# Patient Record
Sex: Male | Born: 1980 | Race: White | Hispanic: No | Marital: Single | Smoking: Never smoker
Health system: Southern US, Community
[De-identification: ages and names within clinical notes are randomized; demographics above are authoritative.]

## PROBLEM LIST (undated history)

## (undated) DIAGNOSIS — S066X9A Traumatic subarachnoid hemorrhage with loss of consciousness of unspecified duration, initial encounter: Secondary | ICD-10-CM

## (undated) DIAGNOSIS — S060X9A Concussion with loss of consciousness of unspecified duration, initial encounter: Secondary | ICD-10-CM

## (undated) HISTORY — DX: Traumatic subarachnoid hemorrhage with loss of consciousness of unspecified duration, initial encounter: S06.6X9A

## (undated) HISTORY — DX: Concussion with loss of consciousness of unspecified duration, initial encounter: S06.0X9A

---

## 2013-01-28 HISTORY — PX: REFRACTIVE SURGERY: SHX103

## 2017-08-19 ENCOUNTER — Telehealth: Payer: Self-pay | Admitting: Neurology

## 2017-08-19 ENCOUNTER — Ambulatory Visit: Payer: Managed Care, Other (non HMO) | Admitting: Neurology

## 2017-08-19 ENCOUNTER — Encounter: Payer: Self-pay | Admitting: Neurology

## 2017-08-19 VITALS — BP 134/99 | HR 101 | Ht 72.0 in | Wt 277.5 lb

## 2017-08-19 DIAGNOSIS — S060X9A Concussion with loss of consciousness of unspecified duration, initial encounter: Secondary | ICD-10-CM | POA: Diagnosis not present

## 2017-08-19 DIAGNOSIS — S065X9A Traumatic subdural hemorrhage with loss of consciousness of unspecified duration, initial encounter: Secondary | ICD-10-CM

## 2017-08-19 DIAGNOSIS — S065XAA Traumatic subdural hemorrhage with loss of consciousness status unknown, initial encounter: Secondary | ICD-10-CM

## 2017-08-19 DIAGNOSIS — S066XAA Traumatic subarachnoid hemorrhage with loss of consciousness status unknown, initial encounter: Secondary | ICD-10-CM | POA: Insufficient documentation

## 2017-08-19 DIAGNOSIS — S066X9A Traumatic subarachnoid hemorrhage with loss of consciousness of unspecified duration, initial encounter: Secondary | ICD-10-CM

## 2017-08-19 DIAGNOSIS — S066X1S Traumatic subarachnoid hemorrhage with loss of consciousness of 30 minutes or less, sequela: Secondary | ICD-10-CM

## 2017-08-19 HISTORY — DX: Traumatic subarachnoid hemorrhage with loss of consciousness of unspecified duration, initial encounter: S06.6X9A

## 2017-08-19 HISTORY — DX: Traumatic subarachnoid hemorrhage with loss of consciousness status unknown, initial encounter: S06.6XAA

## 2017-08-19 HISTORY — DX: Concussion with loss of consciousness of unspecified duration, initial encounter: S06.0X9A

## 2017-08-19 MED ORDER — GABAPENTIN 300 MG PO CAPS: ORAL_CAPSULE | ORAL | 3 refills | Status: DC

## 2017-08-19 NOTE — Telephone Encounter (Signed)
[employer] order sent to GI. They obtain the auth and will reach out to the pt to schedule.  °

## 2017-08-19 NOTE — Progress Notes (Signed)
Reason for visit: Head trauma, concussion  Referring physician: Dr. Zachery DakinsSandbutle  John Hurst is a 37 y.o. male  History of present illness:  John Hurst is a 37 year old gentleman with a history of a fall that occurred on 26 July 2017.  The patient was in [place] at the time.  The patient hit a plate of glass on the back of the head resulting in a laceration.  The patient had loss of consciousness with the fall that lasted several minutes.  The patient was admitted to [facility] and underwent a CT scan of the brain that showed evidence of multifocal subarachnoid and subdural bleeding.  The patient was placed on phenytoin for about 1 week following the accident, he returned home, and he has not returned to work.  He has good and bad days with his symptoms.  He has developed neck and low back pain and has been seen through sports medicine for this.  He was placed on Flexeril temporarily which offered some benefit.  The patient is having a lot of difficulty with sleeping in part related to pain, he averages about 3 or 4 hours of sleep at night.  The patient has difficulty with focusing, but some days are much better than others.  He will have headaches off and on, the headaches are not daily in nature.  He reports no focal numbness or weakness of the face, arms, legs.  He denies any significant changes in balance or difficulty controlling the bowels or the bladder.  He will have occasional episodes of vertigo.  The patient comes to this office for an evaluation.  The patient works in the Estate manager/land agentfinancial field, he does have difficulty focusing, he denies any change in general memory.  Past Medical History:  Diagnosis Date  . Concussion with < 1 hr loss of consciousness 08/19/2017    Past Surgical History:  Procedure Laterality Date  . REFRACTIVE SURGERY  2015    History reviewed. No pertinent family history.  Social history:  reports that he has never smoked. He has never  used smokeless tobacco. He reports that he drinks alcohol. He reports that he does not use drugs.  Medications:  Prior to Admission medications   Medication Sig Start Date End Date Taking? Authorizing Provider  cyclobenzaprine (FLEXERIL) 10 MG tablet Take 1 tablet by mouth 3 (three) times daily as needed.  08/14/17  Yes [provider]  ibuprofen (ADVIL,MOTRIN) 200 MG tablet Take 200 mg by mouth every 6 (six) hours as needed.   Yes [provider]  naproxen (NAPROSYN) 500 MG tablet Take 500 mg by mouth as needed.   Yes [provider]  gabapentin (NEURONTIN) 300 MG capsule One capsule at night for 1 week, then take one twice a day 08/19/17   York SpanielWillis, Charles K, MD     No Known Allergies  ROS:  Out of a complete 14 system review of symptoms, the patient complains only of the following symptoms, and all other reviewed systems are negative.  Fatigue Diarrhea Confusion, headache, weakness, dizziness Depression, decreased energy, disinterest in activities Insomnia, sleepiness  Blood pressure (!) 134/99, pulse (!) 101, height 6' (1.829 m), weight 277 lb 8 oz (125.9 kg).  Physical Exam  General: The patient is alert and cooperative at the time of the examination.  The patient is moderately to markedly obese.  Eyes: Pupils are equal, round, and reactive to light. Discs are flat bilaterally.  Neck: The neck is supple, no carotid  bruits are noted.  Respiratory: The respiratory examination is clear.  Cardiovascular: The cardiovascular examination reveals a regular rate and rhythm, no obvious murmurs or rubs are noted.  Neuromuscular: Range move the cervical spine is full, patient is able to flex the low back to about 85 degrees.  Skin: Extremities are without significant edema.  Neurologic Exam  Mental status: The patient is alert and oriented x 3 at the time of the examination. The patient has apparent normal recent and remote memory, with an apparently normal  attention span and concentration ability.  Cranial nerves: Facial symmetry is present. There is good sensation of the face to pinprick and soft touch bilaterally. The strength of the facial muscles and the muscles to head turning and shoulder shrug are normal bilaterally. Speech is well enunciated, no aphasia or dysarthria is noted. Extraocular movements are full. Visual fields are full. The tongue is midline, and the patient has symmetric elevation of the soft palate. No obvious hearing deficits are noted.  Motor: The motor testing reveals 5 over 5 strength of all 4 extremities. Good symmetric motor tone is noted throughout.  Sensory: Sensory testing is intact to pinprick, soft touch, vibration sensation, and position sense on all 4 extremities. No evidence of extinction is noted.  Coordination: Cerebellar testing reveals good finger-nose-finger and heel-to-shin bilaterally.  Gait and station: Gait is normal. Tandem gait is normal. Romberg is negative. No drift is seen.  Reflexes: Deep tendon reflexes are symmetric and normal bilaterally. Toes are downgoing bilaterally.   Assessment/Plan:  1.  Concussion with loss of consciousness  2.  Posttraumatic subarachnoid hemorrhage and subdural hematoma  The patient will undergo a repeat CT scan of the brain.  He will be placed on gabapentin for the neck and back pain but also to help him sleep at night.  I have recommended that he refrain from going back to work for at least another 4 weeks.  If he believes there are persistent cognitive issues, neuropsychological testing in the future may be warranted.  The patient will follow-up in 3 months.  He will call for any dose adjustments of the gabapentin.  He will start taking 300 mg at night for 1 week and then go to 300 mg twice daily.  A prescription was sent in.  Marlan Palau MD 08/19/2017 10:07 AM  [facility] [address] [place], [place] [address]  Phone  [phone] Fax [phone]

## 2017-08-19 NOTE — Patient Instructions (Signed)
   We will get CT of the head and start gabapentin for the neck and low back, hopefully to help you sleep better as well, call for any dose adjustments.  Neurontin (gabapentin) may result in drowsiness, ankle swelling, gait instability, or possibly dizziness. Please contact our office if significant side effects occur with this medication.

## 2017-09-04 ENCOUNTER — Ambulatory Visit
Admission: RE | Admit: 2017-09-04 | Discharge: 2017-09-04 | Disposition: A | Discharge status: Home / Self Care | Payer: Managed Care, Other (non HMO) | Source: Ambulatory Visit | Attending: Neurology | Admitting: Neurology

## 2017-09-04 DIAGNOSIS — S065XAA Traumatic subdural hemorrhage with loss of consciousness status unknown, initial encounter: Secondary | ICD-10-CM

## 2017-09-04 DIAGNOSIS — S065X9A Traumatic subdural hemorrhage with loss of consciousness of unspecified duration, initial encounter: Secondary | ICD-10-CM

## 2017-09-04 DIAGNOSIS — S060X9A Concussion with loss of consciousness of unspecified duration, initial encounter: Secondary | ICD-10-CM

## 2017-09-05 ENCOUNTER — Telehealth: Payer: Self-pay | Admitting: Neurology

## 2017-09-05 MED ORDER — GABAPENTIN 300 MG PO CAPS: ORAL_CAPSULE | ORAL | 1 refills | Status: DC

## 2017-09-05 NOTE — Telephone Encounter (Signed)
I called the patient.  The patient is having some problems with posttraumatic vertigo.  He believes that the gabapentin is helpful some for the headaches, we will go up on the dose taking 1 in the morning and 2 in the evening of the 300 mg capsules.  A prescription was sent in.

## 2017-09-05 NOTE — Telephone Encounter (Signed)
Pt requesting a call back(from Dr. Anne HahnWillis) to discuss CT and his prescription for gabapentin (NEURONTIN) 300 MG capsule

## 2017-09-05 NOTE — Telephone Encounter (Signed)
  I called the patient.  The CT scan of the head is unremarkable, no evidence of subdural hematoma, will watch patient conservatively at this point.  CT head 09/04/17:  IMPRESSION: This is a normal noncontrasted CT scan of the head

## 2017-09-05 NOTE — Addendum Note (Signed)
Addended by: York SpanielWILLIS, Reann Dobias K on: 09/05/2017 01:23 PM   Modules accepted: Orders

## 2018-02-24 NOTE — Progress Notes (Signed)
[facility]  PATIENT: John Hurst DOB: Jun 08, 1980   REASON FOR VISIT: Follow-up for subdural hematoma HISTORY FROM: Patient    HISTORY OF PRESENT ILLNESS:UPDATE 1/29/2020CM John Hurst, 38 year old male returns for follow-up with history of subarachnoid bleed in June 2019.  He continues to have some vertigo from this and gabapentin has been beneficial.  Gabapentin also helps him to sleep at night.  He returned to work about 2 months ago and has not had any difficulty with concentration.  He works as a Forensic scientistfinancial planner.  His vertigo comes and goes he denies any significant changes in balance no falls.  No difficulty controlling the bowel or bladder.  He has had no focal weakness.  No interval medical issues he returns for reevaluation.  CT of brain 09/04/2017 was normal 08/19/17 John Hurst is a 38 year old gentleman with a history of a fall that occurred on 26 July 2017.  The patient was in [place] at the time.  The patient hit a plate of glass on the back of the head resulting in a laceration.  The patient had loss of consciousness with the fall that lasted several minutes.  The patient was admitted to [facility] and underwent a CT scan of the brain that showed evidence of multifocal subarachnoid and subdural bleeding.  The patient was placed on phenytoin for about 1 week following the accident, he returned home, and he has not returned to work.  He has good and bad days with his symptoms.  He has developed neck and low back pain and has been seen through sports medicine for this.  He was placed on Flexeril temporarily which offered some benefit.  The patient is having a lot of difficulty with sleeping in part related to pain, he averages about 3 or 4 hours of sleep at night.  The patient has difficulty with focusing, but some days are much better than others.  He will have headaches off and on, the headaches are not daily in nature.  He reports no  focal numbness or weakness of the face, arms, legs.  He denies any significant changes in balance or difficulty controlling the bowels or the bladder.  He will have occasional episodes of vertigo.  The patient comes to this office for an evaluation.  The patient works in the Estate manager/land agentfinancial field, he does have difficulty focusing, he denies any change in general memory.   REVIEW OF SYSTEMS: Full 14 system review of systems performed and notable only for those listed, all others are neg:  Constitutional: neg  Cardiovascular: neg Ear/Nose/Throat: neg  Skin: neg Eyes: neg Respiratory: neg Gastroitestinal: neg  Hematology/Lymphatic: neg  Endocrine: Intolerance to heat Musculoskeletal:neg Allergy/Immunology: neg Neurological:  occasional dizziness  Psychiatric: neg Sleep : neg   ALLERGIES: No Known Allergies  HOME MEDICATIONS: Outpatient Medications Prior to Visit  Medication Sig Dispense Refill  . gabapentin (NEURONTIN) 600 MG tablet Take 1,200 mg by mouth at bedtime.    John Kitchen. ibuprofen (ADVIL,MOTRIN) 200 MG tablet Take 200 mg by mouth every 6 (six) hours as needed.    . naproxen (NAPROSYN) 500 MG tablet Take 500 mg by mouth as needed.    . gabapentin (NEURONTIN) 300 MG capsule 1 capsule in the morning, 2 in the evening 270 capsule 1  . cyclobenzaprine (FLEXERIL) 10 MG tablet Take 1 tablet by mouth 3 (three) times daily as needed.      No facility-administered medications prior to visit.     PAST MEDICAL HISTORY:  Past Medical History:  Diagnosis Date  . Concussion with < 1 hr loss of consciousness 08/19/2017  . Subarachnoid hemorrhage following injury (HCC) 08/19/2017    PAST SURGICAL HISTORY: Past Surgical History:  Procedure Laterality Date  . REFRACTIVE SURGERY  2015    FAMILY HISTORY: History reviewed. No pertinent family history.  SOCIAL HISTORY: Social History   Socioeconomic History  . Marital status: Single    Spouse name: Not on file  . Number of children: Not on  file  . Years of education: Masters   . Highest education level: Not on file  Occupational History  . Occupation: Not working right now  Social Needs  . Financial resource strain: Not on file  . Food insecurity:    Worry: Not on file    Inability: Not on file  . Transportation needs:    Medical: Not on file    Non-medical: Not on file  Tobacco Use  . Smoking status: Never Smoker  . Smokeless tobacco: Never Used  Substance and Sexual Activity  . Alcohol use: Yes  . Drug use: Never  . Sexual activity: Not on file  Lifestyle  . Physical activity:    Days per week: Not on file    Minutes per session: Not on file  . Stress: Not on file  Relationships  . Social connections:    Talks on phone: Not on file    Gets together: Not on file    Attends religious service: Not on file    Active member of club or organization: Not on file    Attends meetings of clubs or organizations: Not on file    Relationship status: Not on file  . Intimate partner violence:    Fear of current or ex partner: Not on file    Emotionally abused: Not on file    Physically abused: Not on file    Forced sexual activity: Not on file  Other Topics Concern  . Not on file  Social History Narrative   Right handed    Lives with spouse   Caffeine use: daily     PHYSICAL EXAM  Vitals:   02/25/18 0948  BP: (!) 149/87  Pulse: 77  Weight: 276 lb 3.2 oz (125.3 kg)  Height: 6' (1.829 m)   Body mass index is 37.46 kg/m.  Generalized: Well developed, in no acute distress  Head: normocephalic and atraumatic,. Oropharynx benign bilateral cerumen moderate amount Neck: Supple, no carotid bruits  Cardiac: Regular rate rhythm, no murmur  Musculoskeletal: No deformity   Neurological examination   Mentation: Alert oriented to time, place, history taking. Attention span and concentration appropriate. Recent and remote memory intact.  Follows all commands speech and language fluent.   Cranial nerve II-XII:  Fundoscopic exam reveals sharp disc margins.Pupils were equal round reactive to light extraocular movements were full, visual field were full on confrontational test. Facial sensation and strength were normal. hearing was intact to finger rubbing bilaterally. Uvula tongue midline. head turning and shoulder shrug were normal and symmetric.Tongue protrusion into cheek strength was normal. Motor: normal bulk and tone, full strength in the BUE, BLE, fine finger movements normal, no pronator drift. No focal weakness  Sensory: normal and symmetric to light touch, on the face arms and legs Coordination: finger-nose-finger, heel-to-shin bilaterally, no dysmetria Reflexes: Symmetric upper and lower, plantar responses were flexor bilaterally. Gait and Station: Rising up from seated position without assistance, normal stance,  moderate stride, good arm swing, smooth turning, able to perform  tiptoe, and heel walking without difficulty. Tandem gait is steady  DIAGNOSTIC DATA (LABS, IMAGING, TESTING) - I reviewed patient records, labs, notes, testing and imaging myself where available.  ASSESSMENT AND PLAN  38 y.o. year old male  has a past medical history of Concussion with < 1 hr loss of consciousness (08/19/2017) and Subarachnoid hemorrhage following injury (HCC) (08/19/2017). here to follow-up for his subdural hematoma.  Repeat CT of the brain in August was normal.  Patient continues to have intermittent vertigo.  Gabapentin has been helpful and he would like an increase in dose at night because it does help him sleep.  Patient has returned to work.  He denies any cognition issues    PLAN: Increased gabapentin to 600 mg 2 tabs at night Debrox for cerumen take as directed Stay well-hydrated Follow-up in 6 to 8 months I spent 25 minutes in total face to face time with the patient more than 50% of which was spent counseling and coordination of care, reviewing test results reviewing medications and discussing  and reviewing the diagnosis of traumatic brain injury, concussion, and vertigo and further treatment options.  Explained to patient that the symptoms will get better over time . Nilda Riggs, [facility], [facility], APRN  [facility] [address] [place], [place] [address] [phone]

## 2018-02-25 ENCOUNTER — Encounter

## 2018-02-25 ENCOUNTER — Encounter: Payer: Self-pay | Admitting: Nurse Practitioner

## 2018-02-25 ENCOUNTER — Ambulatory Visit (INDEPENDENT_AMBULATORY_CARE_PROVIDER_SITE_OTHER): Payer: BLUE CROSS/BLUE SHIELD | Admitting: Nurse Practitioner

## 2018-02-25 VITALS — BP 149/87 | HR 77 | Ht 72.0 in | Wt 276.2 lb

## 2018-02-25 DIAGNOSIS — S060X9A Concussion with loss of consciousness of unspecified duration, initial encounter: Secondary | ICD-10-CM | POA: Diagnosis not present

## 2018-02-25 DIAGNOSIS — S066X1S Traumatic subarachnoid hemorrhage with loss of consciousness of 30 minutes or less, sequela: Secondary | ICD-10-CM

## 2018-02-25 MED ORDER — GABAPENTIN 600 MG PO TABS: 1200.0000 mg | ORAL_TABLET | Freq: Every day | ORAL | 1 refills | Status: DC

## 2018-02-25 NOTE — Progress Notes (Signed)
I have read the note, and I agree with the clinical assessment and plan.  Jacorion Klem K Skyra Crichlow   

## 2018-02-25 NOTE — Patient Instructions (Signed)
  Repeat CT of the head was normal Increased gabapentin to 600 mg 2 tabs at night Follow-up in 6 to 8 months

## 2018-08-17 ENCOUNTER — Other Ambulatory Visit: Payer: Self-pay | Admitting: *Deleted

## 2018-08-17 MED ORDER — GABAPENTIN 600 MG PO TABS: 1200.0000 mg | ORAL_TABLET | Freq: Every day | ORAL | 1 refills | Status: DC

## 2018-09-24 ENCOUNTER — Telehealth: Payer: Self-pay

## 2018-09-24 NOTE — Telephone Encounter (Signed)
Unable to get in contact with the patient to r/s his appt with Sarah for 09/28/2018. I left the patient a voicemail letting him know that his appt has been cancelled and he will need to call back to r/s. Office number was provided.

## 2018-09-28 ENCOUNTER — Ambulatory Visit: Payer: BLUE CROSS/BLUE SHIELD | Admitting: Neurology

## 2018-10-04 IMAGING — CT CT HEAD W/O CM
4 series · 15 of 47 positions shown, 17 images · non-contrast
Comparison: None.

CLINICAL DATA: Traumatic brain injury sustained 07/25/2017. Fell 20
feet.

EXAM:
CT HEAD WITHOUT CONTRAST
TECHNIQUE: Contiguous axial images were obtained from the base of the skull
through the vertex without intravenous contrast.

[Series 2: head 5.00 hr40 s3 ibhc · axial · 0.45mm/px · z∈[-568,-453]mm · 7 of 31 slices shown, 9 images]
[im 4/31  brain]
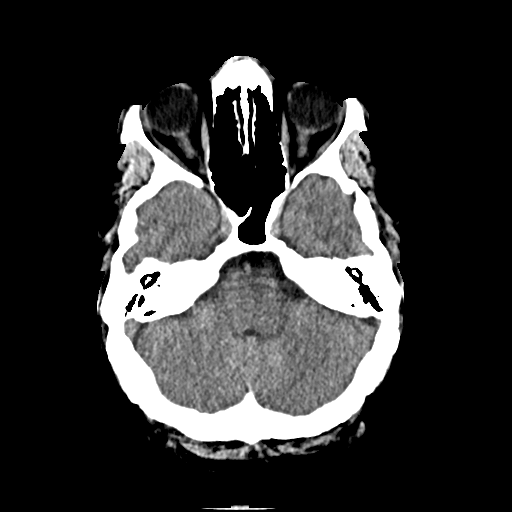
[im 4/31  bone]
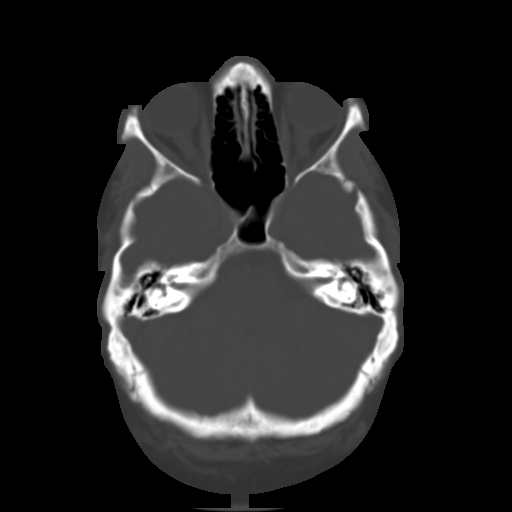
[im 8/31  brain]
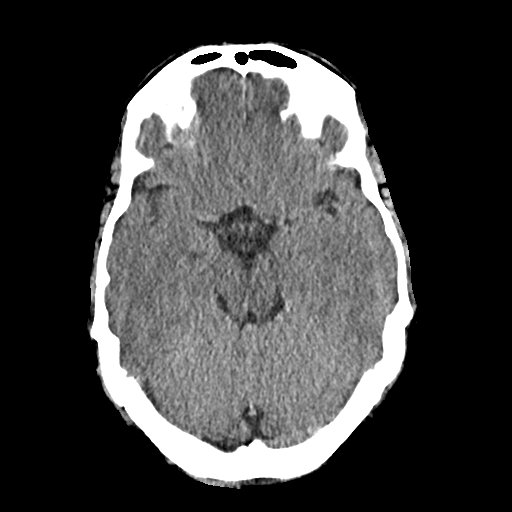
[im 12/31  brain]
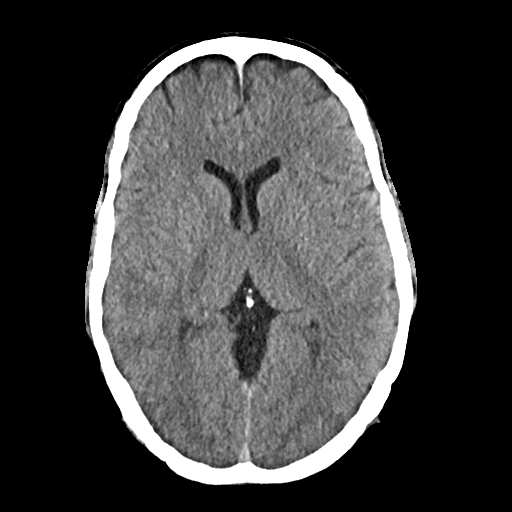
[im 16/31  brain]
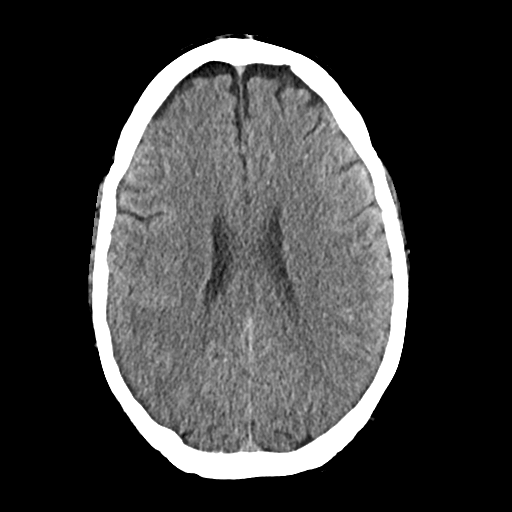
[im 19/31  brain]
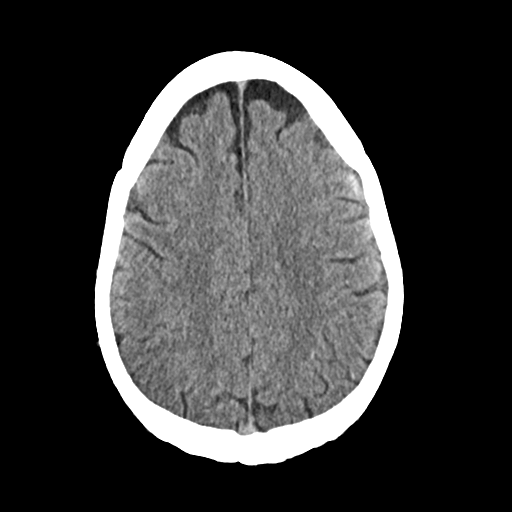
[im 19/31  bone]
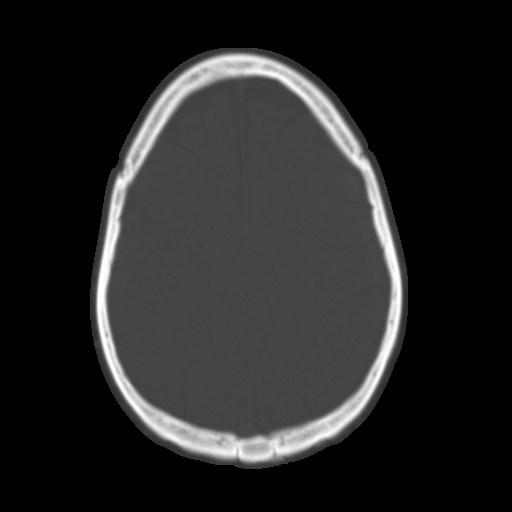
[im 23/31  brain]
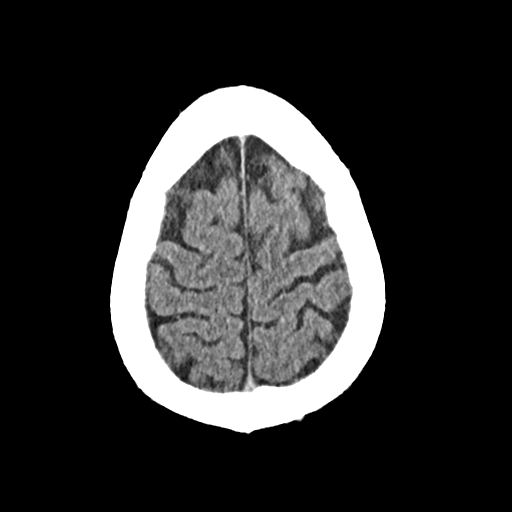
[im 27/31  brain]
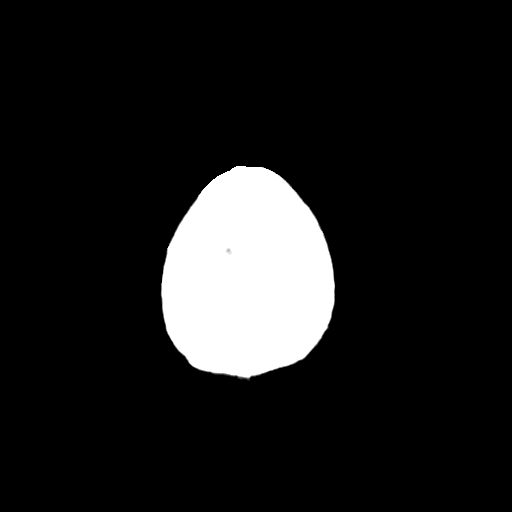

[Series 3: head 2.00 hr60 s3 bone · axial · 0.45mm/px · z∈[-571,-555]mm · 2 of 77 slices shown]
[im 8/77  bone]
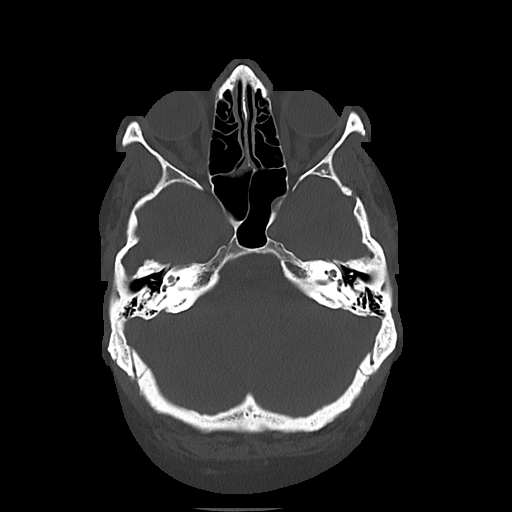
[im 16/77  bone]
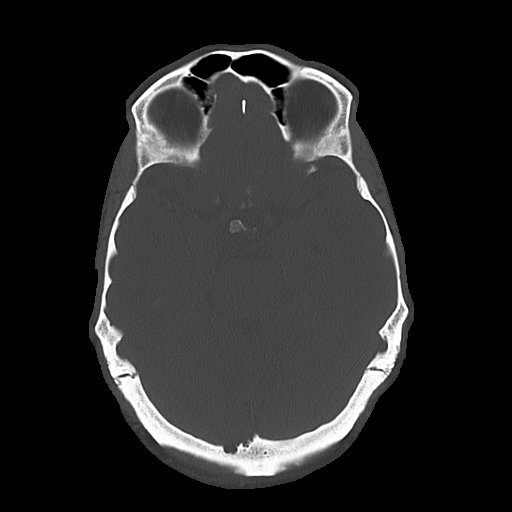

[Series 4: head 3.00 hr40 s3 sag · sagittal · 0.28mm/px · 3 of 88 slices shown]
[im 30/88  brain]
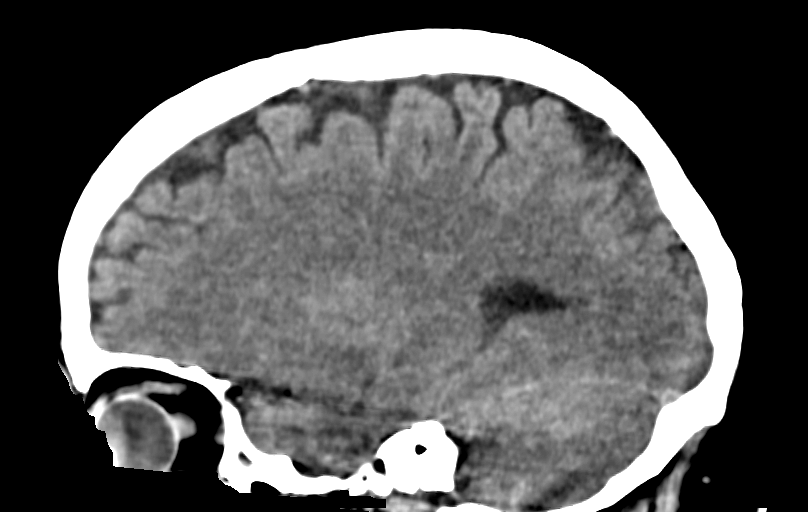
[im 44/88  brain]
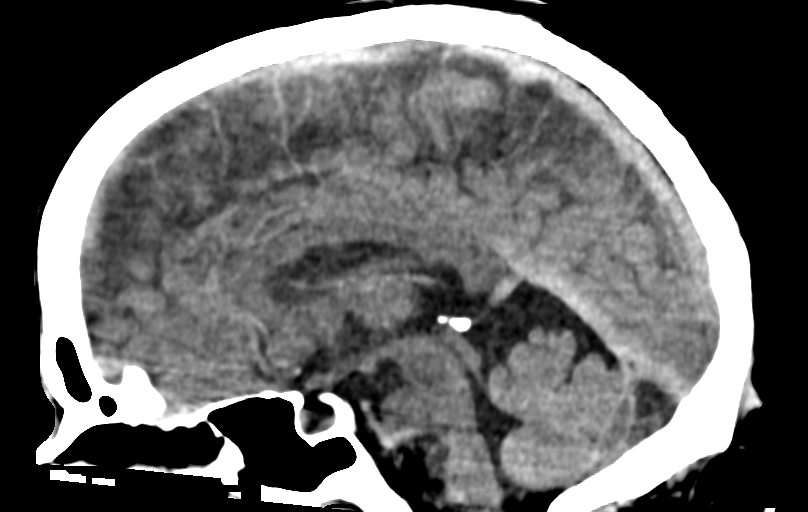
[im 59/88  brain]
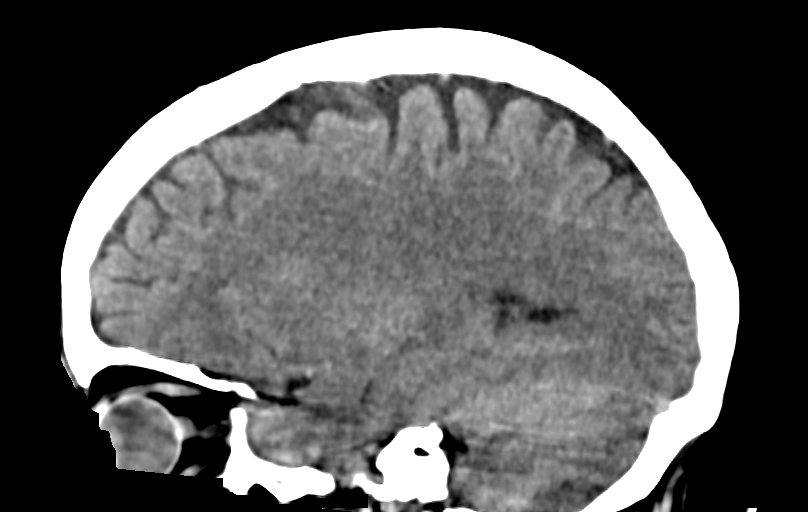

[Series 6: head 3.00 hr40 s3 cor · coronal · 0.28mm/px · 3 of 110 slices shown]
[im 37/110  brain]
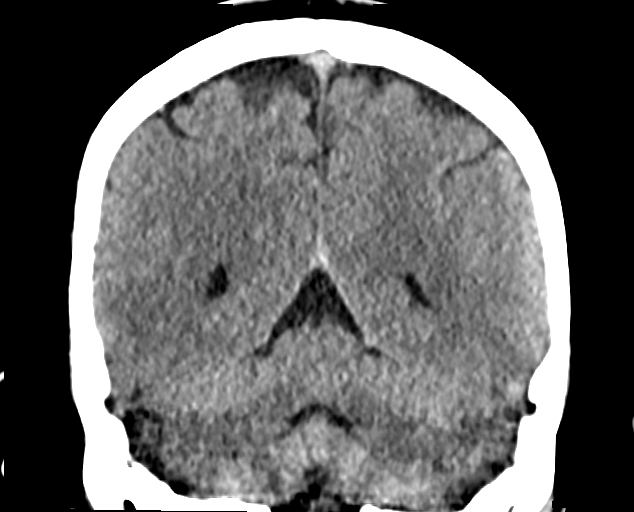
[im 49/110  brain]
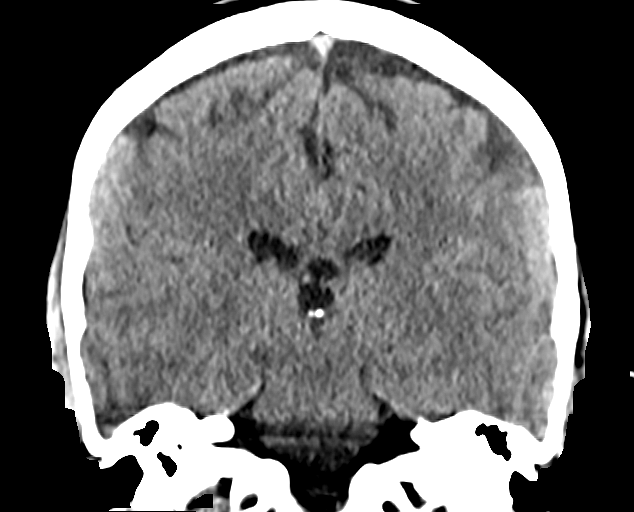
[im 61/110  brain]
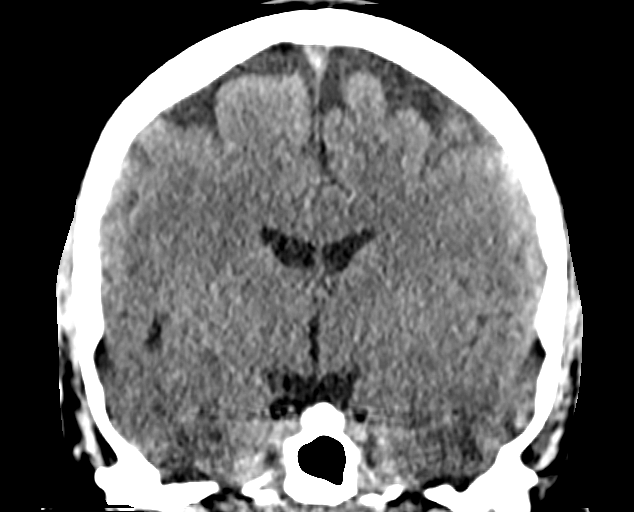

[15 of 47 positions shown; findings below may reference images not displayed]

FINDINGS: Brain: No evidence for acute infarction, hemorrhage, mass lesion,
hydrocephalus, or extra-axial fluid. Normal cerebral volume. No
white matter disease. Artifactual increased signal along the
periphery, over the frontal convexity, is confirmed as
misregistration or motion on coronal reformats.

Vascular: No hyperdense vessel or unexpected calcification.

Skull: Normal. Negative for fracture or focal lesion.

Sinuses/Orbits: No acute finding.

Other: None.
IMPRESSION: Negative exam.

## 2018-11-16 ENCOUNTER — Ambulatory Visit: Payer: BLUE CROSS/BLUE SHIELD | Admitting: Neurology

## 2018-12-02 ENCOUNTER — Ambulatory Visit: Payer: BC Managed Care – PPO | Admitting: Neurology

## 2018-12-15 ENCOUNTER — Ambulatory Visit: Payer: BC Managed Care – PPO | Admitting: Neurology
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
## 2018-12-22 ENCOUNTER — Other Ambulatory Visit: Payer: Self-pay

## 2018-12-22 ENCOUNTER — Ambulatory Visit (INDEPENDENT_AMBULATORY_CARE_PROVIDER_SITE_OTHER): Payer: BC Managed Care – PPO | Admitting: Neurology

## 2018-12-22 ENCOUNTER — Encounter: Payer: Self-pay | Admitting: Neurology

## 2018-12-22 VITALS — BP 131/89 | HR 100 | Temp 97.3°F | Ht 72.0 in | Wt 286.0 lb

## 2018-12-22 DIAGNOSIS — S060X9A Concussion with loss of consciousness of unspecified duration, initial encounter: Secondary | ICD-10-CM

## 2018-12-22 DIAGNOSIS — S066X1S Traumatic subarachnoid hemorrhage with loss of consciousness of 30 minutes or less, sequela: Secondary | ICD-10-CM

## 2018-12-22 MED ORDER — GABAPENTIN 600 MG PO TABS: 1200.0000 mg | ORAL_TABLET | Freq: Every day | ORAL | 1 refills | Status: DC

## 2018-12-22 NOTE — Progress Notes (Signed)
I have read the note, and I agree with the clinical assessment and plan.  Perez Dirico K Jaythen Hamme   

## 2018-12-22 NOTE — Patient Instructions (Addendum)
Continue current medications. I will refill your gabapentin. Happy Thanksgiving :)

## 2019-06-21 ENCOUNTER — Other Ambulatory Visit: Payer: Self-pay

## 2019-06-21 ENCOUNTER — Encounter: Payer: Self-pay | Admitting: Neurology

## 2019-06-21 ENCOUNTER — Ambulatory Visit (INDEPENDENT_AMBULATORY_CARE_PROVIDER_SITE_OTHER): Payer: BC Managed Care – PPO | Admitting: Neurology

## 2019-06-21 VITALS — BP 147/96 | HR 76 | Ht 72.0 in | Wt 300.8 lb

## 2019-06-21 DIAGNOSIS — S060X9A Concussion with loss of consciousness of unspecified duration, initial encounter: Secondary | ICD-10-CM | POA: Diagnosis not present

## 2019-06-21 DIAGNOSIS — S066X1S Traumatic subarachnoid hemorrhage with loss of consciousness of 30 minutes or less, sequela: Secondary | ICD-10-CM

## 2019-06-21 MED ORDER — TRAZODONE HCL 50 MG PO TABS: 50.0000 mg | ORAL_TABLET | Freq: Every day | ORAL | 4 refills | Status: DC

## 2019-06-21 NOTE — Progress Notes (Signed)
I have read the note, and I agree with the clinical assessment and plan.  Roniesha Hollingshead K Nayshawn Mesta   

## 2019-06-21 NOTE — Patient Instructions (Addendum)
It was great see you today  Decrease dose of gabapentin to 600 mg at bedtime for 1 week, then stop  Then start trazodone 50 mg at bedtime Let me know how you do on trazodone, we can increase dose to 100 mg, send a mychart message  See you back in 4 months, virtual visit

## 2019-06-21 NOTE — Progress Notes (Signed)
PATIENT: John Hurst DOB: August 29, 1980  REASON FOR VISIT: follow up HISTORY FROM: patient  HISTORY OF PRESENT ILLNESS: Today 06/21/19  John Hurst is a 39 year old male with history of subarachnoid bleed in June 2019.  Follow-up CT scan in August 2019 was normal.  He has been taking gabapentin 1200 mg at bedtime, for sleep, vertigo, and back pain as result of the accident.  He no longer complains of the vertigo or back pain.  He sleeps fairly well, but often wakes up during the night.  He does feel like he falls into deep sleep, can be drowsy in the morning.  Is interested in trying something else for sleep, as the vertigo and back pain are no longer an issue.  He works full-time in Biomedical engineer.  He does his job well.  His memory and concentration seem to be well.  He and his wife are expecting their first baby in a few months, he lives in [place].  He presents today for evaluation unaccompanied.   HISTORY 12/22/2018 SS: John Hurst is a 39 year old male with history of subarachnoid bleed in June 2019.  He has continued to complain of vertigo as result, gabapentin has been beneficial.  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  CT of the brain in August 2019 was normal.  At last visit his gabapentin was increased to 1,200 mg at bedtime.  He reports for the most part, his symptoms have diminished or gone away.  He feels overall in 60 to 70% improved.  He still may have some vertigo every few days, if he sits down it goes away.  He does notice if he misses his gabapentin he may have some back pain.  The gabapentin has been beneficial for his pain, vertigo and sleep.  He still does not feel that his sleep has completely returned to normal.  He does not have difficulty initiating sleep, but only sleeps restful for 4-6 hours.  Since returning to work, he feels he is doing his job well.  His memory, concentration, and focus has improved.  He was recently married.  He lives in  [place], [place].  He presents today for follow-up.    REVIEW OF SYSTEMS: Out of a complete 14 system review of symptoms, the patient complains only of the following symptoms, and all other reviewed systems are negative.  Insomnia  ALLERGIES: No Known Allergies  HOME MEDICATIONS: Outpatient Medications Prior to Visit  Medication Sig Dispense Refill  . ibuprofen (ADVIL,MOTRIN) 200 MG tablet Take 200 mg by mouth as needed.     . naproxen (NAPROSYN) 500 MG tablet Take 500 mg by mouth as needed.    . gabapentin (NEURONTIN) 600 MG tablet Take 2 tablets (1,200 mg total) by mouth at bedtime. 180 tablet 1   No facility-administered medications prior to visit.    PAST MEDICAL HISTORY: Past Medical History:  Diagnosis Date  . Concussion with < 1 hr loss of consciousness 08/19/2017  . Subarachnoid hemorrhage following injury (HCC) 08/19/2017    PAST SURGICAL HISTORY: Past Surgical History:  Procedure Laterality Date  . REFRACTIVE SURGERY  2015    FAMILY HISTORY: No family history on file.  SOCIAL HISTORY: Social History   Socioeconomic History  . Marital status: Single    Spouse name: Not on file  . Number of children: Not on file  . Years of education: Masters   . Highest education level: Not on file  Occupational History  . Occupation: Not working  right now  Tobacco Use  . Smoking status: Never Smoker  . Smokeless tobacco: Never Used  Substance and Sexual Activity  . Alcohol use: Yes  . Drug use: Never  . Sexual activity: Not on file  Other Topics Concern  . Not on file  Social History Narrative   Right handed    Lives with spouse   Caffeine use: daily   Social Determinants of Health   Financial Resource Strain:   . Difficulty of Paying Living Expenses:   Food Insecurity:   . Worried About Programme researcher, broadcasting/film/video in the Last Year:   . Barista in the Last Year:   Transportation Needs:   . Freight forwarder (Medical):   Marland Kitchen Lack of Transportation  (Non-Medical):   Physical Activity:   . Days of Exercise per Week:   . Minutes of Exercise per Session:   Stress:   . Feeling of Stress :   Social Connections:   . Frequency of Communication with Friends and Family:   . Frequency of Social Gatherings with Friends and Family:   . Attends Religious Services:   . Active Member of Clubs or Organizations:   . Attends Banker Meetings:   Marland Kitchen Marital Status:   Intimate Partner Violence:   . Fear of Current or Ex-Partner:   . Emotionally Abused:   Marland Kitchen Physically Abused:   . Sexually Abused:    PHYSICAL EXAM  Vitals:   06/21/19 0741  BP: (!) 147/96  Pulse: 76  Weight: (!) 300 lb 12.8 oz (136.4 kg)  Height: 6' (1.829 m)   Body mass index is 40.8 kg/m.  Generalized: Well developed, in no acute distress   Neurological examination  Mentation: Alert oriented to time, place, history taking. Follows all commands speech and language fluent Cranial nerve II-XII: Pupils were equal round reactive to light. Extraocular movements were full, visual field were full on confrontational test. Facial sensation and strength were normal.  Head turning and shoulder shrug  were normal and symmetric. Motor: The motor testing reveals 5 over 5 strength of all 4 extremities. Good symmetric motor tone is noted throughout.  Sensory: Sensory testing is intact to soft touch on all 4 extremities. No evidence of extinction is noted.  Coordination: Cerebellar testing reveals good finger-nose-finger and heel-to-shin bilaterally.  Gait and station: Gait is normal.  Reflexes: Deep tendon reflexes are symmetric and normal bilaterally.   DIAGNOSTIC DATA (LABS, IMAGING, TESTING) - I reviewed patient records, labs, notes, testing and imaging myself where available.  No results found for: WBC, HGB, HCT, MCV, PLT No results found for: NA, K, CL, CO2, GLUCOSE, BUN, CREATININE, CALCIUM, PROT, ALBUMIN, AST, ALT, ALKPHOS, BILITOT, GFRNONAA, GFRAA No results found  for: CHOL, HDL, LDLCALC, LDLDIRECT, TRIG, CHOLHDL No results found for: PZWC5E No results found for: VITAMINB12 No results found for: TSH    ASSESSMENT AND PLAN 39 y.o. year old male  has a past medical history of Concussion with < 1 hr loss of consciousness (08/19/2017) and Subarachnoid hemorrhage following injury (HCC) (08/19/2017). here with:  1.  Concussion with loss of consciousness  2.  Posttraumatic subarachnoid hemorrhage and subdural hematoma  The posttraumatic symptoms of vertigo and back pain are no longer an issue.  He remains on gabapentin 1200 mg at bedtime, we were previously treating insomnia, back pain, and vertigo following the accident.  He wishes to try something else for sleep, and come off gabapentin, as vertigo low back pain are no  longer significant.  He will take gabapentin 600 mg at bedtime for a week, then stop.  We will then start trazodone 50 mg at bedtime (wants to know how he does on single agent), he will send a MyChart message, we can go up on the dose if needed.  I will see him back in 4 months for a virtual visit (he lives in [place]), if he is doing well, we will transition him to primary care, follow-up in our office on an as-needed basis.  I spent 20 minutes of face-to-face and non-face-to-face time with patient.  This included previsit chart review, lab review, study review, order entry, electronic health record documentation, patient education.  Butler Denmark, AGNP-C, DNP 06/21/2019, 8:11 AM [facility] [address], [place], [place] [address] [phone]

## 2019-08-18 ENCOUNTER — Encounter: Payer: Self-pay | Admitting: Neurology

## 2019-08-24 MED ORDER — GABAPENTIN 600 MG PO TABS: 600.0000 mg | ORAL_TABLET | Freq: Two times a day (BID) | ORAL | 0 refills | Status: DC

## 2019-08-24 NOTE — Telephone Encounter (Signed)
Patient called and confirmed that he would like the Gabapentin refilled.

## 2019-10-25 ENCOUNTER — Telehealth: Payer: Self-pay | Admitting: Neurology

## 2019-10-25 ENCOUNTER — Telehealth: Payer: BC Managed Care – PPO | Admitting: Neurology

## 2019-10-25 NOTE — Telephone Encounter (Signed)
Pt called wanting to know if he can be fitted in sooner than the appt he was given due to provider being out. Please advise.

## 2019-10-25 NOTE — Telephone Encounter (Signed)
Called and spoke with patient. I advised him that I looked at [facility] schedule and she does not have any openings until December. He is aware that I will periodically check her schedule for cancellations. He wishes to keep the appt as a MyChart video visit. Will leave encounter in my inbox as a reminder to check Sarah's schedule for cancellations.

## 2019-10-25 NOTE — Progress Notes (Deleted)
Virtual Visit via Video Note  I connected with John Hurst on 10/25/19 at  3:15 PM EDT by a video enabled telemedicine application and verified that I am speaking with the correct person using two identifiers.  Location: Patient: *** Provider: ***   I discussed the limitations of evaluation and management by telemedicine and the availability of in person appointments. The patient expressed understanding and agreed to proceed.  History of Present Illness: 10/25/2019 SS:   06/21/2019 SS: John Hurst is a 39 year old male with history of subarachnoid bleed in June 2019.  Follow-up CT scan in August 2019 was normal.  He has been taking gabapentin 1200 mg at bedtime, for sleep, vertigo, and back pain as result of the accident.  He no longer complains of the vertigo or back pain.  He sleeps fairly well, but often wakes up during the night.  He does feel like he falls into deep sleep, can be drowsy in the morning.  Is interested in trying something else for sleep, as the vertigo and back pain are no longer an issue.  He works full-time in Biomedical engineer.  He does his job well.  His memory and concentration seem to be well.  He and his wife are expecting their first baby in a few months, he lives in [place].  He presents today for evaluation unaccompanied.   Observations/Objective:   Assessment and Plan:   Follow Up Instructions:    I discussed the assessment and treatment plan with the patient. The patient was provided an opportunity to ask questions and all were answered. The patient agreed with the plan and demonstrated an understanding of the instructions.   The patient was advised to call back or seek an in-person evaluation if the symptoms worsen or if the condition fails to improve as anticipated.  I provided *** minutes of non-face-to-face time during this encounter.   Glean Salvo, NP

## 2019-10-26 ENCOUNTER — Telehealth (INDEPENDENT_AMBULATORY_CARE_PROVIDER_SITE_OTHER): Payer: BC Managed Care – PPO | Admitting: Neurology

## 2019-10-26 ENCOUNTER — Encounter: Payer: Self-pay | Admitting: Neurology

## 2019-10-26 DIAGNOSIS — S066X1S Traumatic subarachnoid hemorrhage with loss of consciousness of 30 minutes or less, sequela: Secondary | ICD-10-CM | POA: Diagnosis not present

## 2019-10-26 DIAGNOSIS — S060X9A Concussion with loss of consciousness of unspecified duration, initial encounter: Secondary | ICD-10-CM

## 2019-10-26 MED ORDER — GABAPENTIN 600 MG PO TABS: 600.0000 mg | ORAL_TABLET | Freq: Two times a day (BID) | ORAL | 3 refills | Status: DC

## 2019-10-26 NOTE — Progress Notes (Signed)
    Virtual Visit via Video Note  I connected with John Hurst on 10/26/19 at  9:15 AM EDT by a video enabled telemedicine application and verified that I am speaking with the correct person using two identifiers.  Location: Patient: at his work in the conference room  Provider: in the office    I discussed the limitations of evaluation and management by telemedicine and the availability of in person appointments. The patient expressed understanding and agreed to proceed.  History of Present Illness: 10/26/2019 SS: John Hurst is a 39 year old male with history of subarachnoid bleed in June 2019, follow-up CT scan in August 2019 was normal.  Has been taking gabapentin at bedtime for sleep, vertigo, and back pain as result of the accident.  He tried to switch from gabapentin to trazodone, was not as effective, had more back pain.  He did not have any trouble sleeping before the accident.  He wishes to remain on gabapentin, tolerates well.  He continues to work full-time, he has a 12-month-old little girl with his wife.  No changes to overall health.  The vertigo has resolved.  Lives in [place], has yet to establish with PCP.  06/21/2019 SS: John Hurst is a 39 year old male with history of subarachnoid bleed in June 2019.  Follow-up CT scan in August 2019 was normal.  He has been taking gabapentin 1200 mg at bedtime, for sleep, vertigo, and back pain as result of the accident.  He no longer complains of the vertigo or back pain.  He sleeps fairly well, but often wakes up during the night.  He does feel like he falls into deep sleep, can be drowsy in the morning.  Is interested in trying something else for sleep, as the vertigo and back pain are no longer an issue.  He works full-time in Biomedical engineer.  He does his job well.  His memory and concentration seem to be well.  He and his wife are expecting their first baby in a few months, he lives in [place].  He presents today for evaluation  unaccompanied.   Observations/Objective: Via virtual visit, is alert and oriented, speech is clear and concise, facial symmetry noted, follows commands well, excellent historian, very pleasant   Assessment and Plan: 1.  Concussion with loss of consciousness 2.  Posttraumatic subarachnoid hemorrhage and subdural hematoma  -Continue gabapentin 600 mg twice a day for insomnia and back pain -He lives in [place], will establish with PCP  -Follow-up here in 1 year or sooner if needed  Follow Up Instructions: 10/25/2020 3:45, made as in office, but will switch to VV   I discussed the assessment and treatment plan with the patient. The patient was provided an opportunity to ask questions and all were answered. The patient agreed with the plan and demonstrated an understanding of the instructions.   The patient was advised to call back or seek an in-person evaluation if the symptoms worsen or if the condition fails to improve as anticipated.  I spent 20 minutes of face-to-face and non-face-to-face time with patient.  This included previsit chart review, lab review, study review, order entry, electronic health record documentation, patient education.  Otila Kluver, DNP  [facility] [address] [place], [place] [address] [phone]

## 2019-10-26 NOTE — Telephone Encounter (Signed)
John Hurst had 2 cancellations today. Offered patient the 915am appt and he accepted. Appt for December 2021 has been cancelled. Nothing further needed.

## 2019-10-26 NOTE — Progress Notes (Signed)
I have read the note, and I agree with the clinical assessment and plan.  Loraine Bhullar K Woodford Strege   

## 2020-01-03 ENCOUNTER — Telehealth: Payer: BC Managed Care – PPO | Admitting: Neurology

## 2020-10-24 ENCOUNTER — Telehealth: Payer: Self-pay | Admitting: Neurology
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
## 2020-10-25 ENCOUNTER — Ambulatory Visit: Payer: BC Managed Care – PPO | Admitting: Neurology

## 2020-10-25 ENCOUNTER — Encounter: Payer: Self-pay | Admitting: Neurology

## 2020-10-25 ENCOUNTER — Telehealth (INDEPENDENT_AMBULATORY_CARE_PROVIDER_SITE_OTHER): Payer: BC Managed Care – PPO | Admitting: Neurology

## 2020-10-25 DIAGNOSIS — S060X9A Concussion with loss of consciousness of unspecified duration, initial encounter: Secondary | ICD-10-CM

## 2020-10-25 DIAGNOSIS — G8929 Other chronic pain: Secondary | ICD-10-CM | POA: Diagnosis not present

## 2020-10-25 DIAGNOSIS — M545 Low back pain, unspecified: Secondary | ICD-10-CM | POA: Diagnosis not present

## 2020-10-25 HISTORY — DX: Other chronic pain: G89.29

## 2020-10-25 MED ORDER — GABAPENTIN 600 MG PO TABS: 600.0000 mg | ORAL_TABLET | Freq: Two times a day (BID) | ORAL | 3 refills | Status: AC
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
